# Patient Record
Sex: Female | Born: 1935 | Hispanic: Yes | Marital: Married | Smoking: Never smoker
Health system: Southern US, Community
[De-identification: ages and names within clinical notes are randomized; demographics above are authoritative.]

## PROBLEM LIST (undated history)

## (undated) DIAGNOSIS — E1169 Type 2 diabetes mellitus with other specified complication: Secondary | ICD-10-CM

## (undated) DIAGNOSIS — I10 Essential (primary) hypertension: Secondary | ICD-10-CM

## (undated) DIAGNOSIS — E669 Obesity, unspecified: Secondary | ICD-10-CM

---

## 2015-08-14 ENCOUNTER — Other Ambulatory Visit: Payer: Self-pay

## 2015-08-14 ENCOUNTER — Emergency Department: Payer: Medicaid Other

## 2015-08-14 ENCOUNTER — Inpatient Hospital Stay
Admission: EM | Admit: 2015-08-14 | Discharge: 2015-08-16 | DRG: 195 | Disposition: A | Discharge status: Home / Self Care | Payer: Medicaid Other | Attending: Internal Medicine | Admitting: Internal Medicine

## 2015-08-14 DIAGNOSIS — E1140 Type 2 diabetes mellitus with diabetic neuropathy, unspecified: Secondary | ICD-10-CM | POA: Diagnosis present

## 2015-08-14 DIAGNOSIS — Z79899 Other long term (current) drug therapy: Secondary | ICD-10-CM | POA: Diagnosis not present

## 2015-08-14 DIAGNOSIS — Z7409 Other reduced mobility: Secondary | ICD-10-CM

## 2015-08-14 DIAGNOSIS — Z7982 Long term (current) use of aspirin: Secondary | ICD-10-CM

## 2015-08-14 DIAGNOSIS — Z6827 Body mass index (BMI) 27.0-27.9, adult: Secondary | ICD-10-CM

## 2015-08-14 DIAGNOSIS — E1165 Type 2 diabetes mellitus with hyperglycemia: Secondary | ICD-10-CM | POA: Diagnosis present

## 2015-08-14 DIAGNOSIS — E669 Obesity, unspecified: Secondary | ICD-10-CM | POA: Diagnosis present

## 2015-08-14 DIAGNOSIS — E785 Hyperlipidemia, unspecified: Secondary | ICD-10-CM | POA: Diagnosis present

## 2015-08-14 DIAGNOSIS — I10 Essential (primary) hypertension: Secondary | ICD-10-CM | POA: Diagnosis present

## 2015-08-14 DIAGNOSIS — J189 Pneumonia, unspecified organism: Secondary | ICD-10-CM | POA: Diagnosis present

## 2015-08-14 DIAGNOSIS — R110 Nausea: Secondary | ICD-10-CM

## 2015-08-14 HISTORY — DX: Type 2 diabetes mellitus with other specified complication: E66.9

## 2015-08-14 HISTORY — DX: Essential (primary) hypertension: I10

## 2015-08-14 HISTORY — DX: Type 2 diabetes mellitus with other specified complication: E11.69

## 2015-08-14 LAB — URINALYSIS COMPLETE WITH MICROSCOPIC (ARMC ONLY)
Bilirubin Urine: NEGATIVE
GLUCOSE, UA: 50 mg/dL — AB
Ketones, ur: NEGATIVE mg/dL
Leukocytes, UA: NEGATIVE
Nitrite: NEGATIVE
PROTEIN: 100 mg/dL — AB
SPECIFIC GRAVITY, URINE: 1.016 (ref 1.005–1.030)
pH: 5 (ref 5.0–8.0)

## 2015-08-14 LAB — BLOOD GAS, VENOUS
ACID-BASE EXCESS: 3 mmol/L (ref 0.0–3.0)
BICARBONATE: 28.5 meq/L — AB (ref 21.0–28.0)
PCO2 VEN: 46 mmHg (ref 44.0–60.0)
PH VEN: 7.4 (ref 7.320–7.430)
Patient temperature: 37

## 2015-08-14 LAB — COMPREHENSIVE METABOLIC PANEL
ALBUMIN: 3.7 g/dL (ref 3.5–5.0)
ALK PHOS: 96 U/L (ref 38–126)
ALT: 20 U/L (ref 14–54)
AST: 23 U/L (ref 15–41)
Anion gap: 8 (ref 5–15)
BILIRUBIN TOTAL: 0.8 mg/dL (ref 0.3–1.2)
BUN: 22 mg/dL — AB (ref 6–20)
CALCIUM: 8.5 mg/dL — AB (ref 8.9–10.3)
CO2: 25 mmol/L (ref 22–32)
CREATININE: 0.92 mg/dL (ref 0.44–1.00)
Chloride: 102 mmol/L (ref 101–111)
GFR calc Af Amer: >60 mL/min (ref >60)
GFR calc non Af Amer: 58 mL/min — ABNORMAL LOW (ref >60)
GLUCOSE: 178 mg/dL — AB (ref 65–99)
Potassium: 4 mmol/L (ref 3.5–5.1)
SODIUM: 135 mmol/L (ref 135–145)
TOTAL PROTEIN: 7.8 g/dL (ref 6.5–8.1)

## 2015-08-14 LAB — CBC
HEMATOCRIT: 37.8 % (ref 35.0–47.0)
HEMOGLOBIN: 12.8 g/dL (ref 12.0–16.0)
MCH: 30.9 pg (ref 26.0–34.0)
MCHC: 33.9 g/dL (ref 32.0–36.0)
MCV: 91.2 fL (ref 80.0–100.0)
Platelets: 204 10*3/uL (ref 150–440)
RBC: 4.15 MIL/uL (ref 3.80–5.20)
RDW: 12.8 % (ref 11.5–14.5)
WBC: 14.1 10*3/uL — ABNORMAL HIGH (ref 3.6–11.0)

## 2015-08-14 MED ORDER — LEVOFLOXACIN IN D5W 750 MG/150ML IV SOLN
750.0000 mg | Freq: Once | INTRAVENOUS | Status: AC
  Administered 2015-08-14: 750 mg via INTRAVENOUS
  Filled 2015-08-14: qty 150

## 2015-08-14 MED ORDER — SODIUM CHLORIDE 0.9 % IV BOLUS (SEPSIS)
500.0000 mL | Freq: Once | INTRAVENOUS | Status: AC
  Administered 2015-08-14: 500 mL via INTRAVENOUS

## 2015-08-14 MED ORDER — ACETAMINOPHEN 500 MG PO TABS
1000.0000 mg | ORAL_TABLET | Freq: Once | ORAL | Status: AC
Start: 2015-08-14 — End: 2015-08-14
  Administered 2015-08-14: 1000 mg via ORAL
  Filled 2015-08-14: qty 2

## 2015-08-14 NOTE — ED Notes (Signed)
MD at bedside for reeval

## 2015-08-14 NOTE — ED Notes (Signed)
Pt ambulatory to and from restroom with assistance without incident. 

## 2015-08-14 NOTE — ED Notes (Signed)
Interpreter requested; assisted pt out of car to wheelchair; son reports pt started feeling bad this am with fever, cough and body aches; pt awake and alert at present

## 2015-08-14 NOTE — ED Provider Notes (Signed)
[facility] Emergency Department Provider Note  ____________________________________________  Time seen: Approximately 9:30 PM  I have reviewed the triage vital signs and the nursing notes.   HISTORY  Chief Complaint Generalized Body Aches; Cough; and Fever    HPI Carly Jones is a 81 y.o. female , with a history of DM 2, presenting for fever, decreased mobility, cough, nausea without vomiting.  Per family and patient, her symptoms started earlier today. No vomiting, diarrhea, abdominal pain, known sick contacts or recent travel outside the [place]. Patient was hyperglycemic at home to the 300s today.   No past medical history on file. DM2  There are no active problems to display for this patient.   No past surgical history on file.  No current outpatient prescriptions on file.  Allergies Review of patient's allergies indicates no known allergies.  No family history on file.  Social History Social History  Substance Use Topics  . Smoking status: Not on file  . Smokeless tobacco: Not on file  . Alcohol Use: Not on file    Review of Systems  Constitutional: Positive for fever. No syncope. Eyes: No visual changes. ENT: No sore throat. Cardiovascular: Denies chest pain, palpitations. Respiratory: Denies shortness of breath.  Nonproductive cough. Gastrointestinal: No abdominal pain.  Nausea without vomiting.  No diarrhea.  No constipation. Genitourinary: Negative for dysuria. Musculoskeletal: Negative for back pain. Skin: Negative for rash. Neurological: Negative for headaches, focal weakness or numbness.  10-point ROS otherwise negative.  ____________________________________________   PHYSICAL EXAM:  VITAL SIGNS: ED Triage Vitals  Enc Vitals Group     BP 08/14/15 2123 135/60 mmHg     Pulse Rate 08/14/15 2123 84     Resp --      Temp 08/14/15 2123 102.2 F (39 C)     Temp Source 08/14/15 2123 Oral     SpO2  08/14/15 2123 95 %     Weight 08/14/15 2123 161 lb (73.029 kg)     Height 08/14/15 2123  (1.575 m)     Head Cir --      Peak Flow --      Pain Score 08/14/15 2128 6     Pain Loc --      Pain Edu? --      Excl. in GC? --     Constitutional: Elderly-appearing female who appears chronically ill. She is alert and oriented and able to answer questions appropriately.  Eyes: Conjunctivae are normal.  EOMI. Head: Atraumatic. Nose: No congestion/rhinnorhea. Mouth/Throat: Mucous membranes are mildly dry.  Neck: No stridor.  Supple. Full range of motion without pain  Cardiovascular: Normal rate, regular rhythm. No murmurs, rubs or gallops.  Respiratory: Normal respiratory effort.  No retractions. Lungs CTAB.  No wheezes, rales or ronchi. Gastrointestinal: Obese. Soft and nontender. No distention. No peritoneal signs. Musculoskeletal: Mild nonpitting pedal edema. Neurologic:  Normal speech and language. No gross focal neurologic deficits are appreciated.  Skin:  Skin is warm, dry and intact. No rash noted. Psychiatric: Mood and affect are normal. Speech and behavior are normal.  Normal judgement.  ____________________________________________   LABS (all labs ordered are listed, but only abnormal results are displayed)  Labs Reviewed  URINE CULTURE  CULTURE, BLOOD (ROUTINE X 2)  CULTURE, BLOOD (ROUTINE X 2)  CBC  COMPREHENSIVE METABOLIC PANEL  URINALYSIS COMPLETEWITH MICROSCOPIC ([facility] ONLY)  BLOOD GAS, VENOUS   ____________________________________________  EKG  ED ECG REPORT I, Rockne Menghini, the attending physician, personally viewed and  interpreted this ECG.   Date: 08/14/2015  EKG Time: 2137  Rate: 82  Rhythm: normal sinus rhythm  Axis: Leftward  Intervals:none  ST&T Change: No ST elevation, no ischemic changes.  ____________________________________________  RADIOLOGY  No results  found.  ____________________________________________   PROCEDURES  Procedure(s) performed: None  Critical Care performed: No ____________________________________________   INITIAL IMPRESSION / ASSESSMENT AND PLAN / ED COURSE  Pertinent labs & imaging results that were available during my care of the patient were reviewed by me and considered in my medical decision making (see chart for details).  81 y.o. female with a history of diabetes presenting with fever, cough, and decreased mobility.  Plan to evaluate for possible pneumonia, rule out UTI, consider bacteremia. There is no sign of meningismus at this time.  ____________________________________________  FINAL CLINICAL IMPRESSION(S) / ED DIAGNOSES  Final diagnoses:  None      NEW MEDICATIONS STARTED DURING THIS VISIT:  New Prescriptions   No medications on file     Rockne Menghini, MD 08/14/15 2139

## 2015-08-15 ENCOUNTER — Encounter: Payer: Self-pay | Admitting: Internal Medicine

## 2015-08-15 LAB — GLUCOSE, CAPILLARY
Glucose-Capillary: 151 mg/dL — ABNORMAL HIGH (ref 65–99)
Glucose-Capillary: 149 mg/dL — ABNORMAL HIGH (ref 65–99)
Glucose-Capillary: 181 mg/dL — ABNORMAL HIGH (ref 65–99)
Glucose-Capillary: 202 mg/dL — ABNORMAL HIGH (ref 65–99)
Glucose-Capillary: 158 mg/dL — ABNORMAL HIGH (ref 65–99)

## 2015-08-15 LAB — HEMOGLOBIN A1C: Hgb A1c MFr Bld: 10.2 % — ABNORMAL HIGH (ref 4.0–6.0)

## 2015-08-15 MED ORDER — ATORVASTATIN CALCIUM 20 MG PO TABS
10.0000 mg | ORAL_TABLET | Freq: Every day | ORAL | Status: DC
  Administered 2015-08-15 – 2015-08-16 (×2): 10 mg via ORAL
  Filled 2015-08-15 (×2): qty 1

## 2015-08-15 MED ORDER — SODIUM CHLORIDE 0.9 % IJ SOLN
3.0000 mL | Freq: Two times a day (BID) | INTRAMUSCULAR | Status: DC
  Administered 2015-08-15: 3 mL via INTRAVENOUS

## 2015-08-15 MED ORDER — ONDANSETRON HCL 4 MG PO TABS: 4.0000 mg | ORAL_TABLET | Freq: Four times a day (QID) | ORAL | Status: DC | PRN

## 2015-08-15 MED ORDER — LISINOPRIL 10 MG PO TABS
10.0000 mg | ORAL_TABLET | Freq: Every day | ORAL | Status: DC
  Administered 2015-08-15 – 2015-08-16 (×2): 10 mg via ORAL
  Filled 2015-08-15 (×2): qty 1

## 2015-08-15 MED ORDER — ASPIRIN 81 MG PO CHEW
81.0000 mg | CHEWABLE_TABLET | Freq: Every day | ORAL | Status: DC
  Administered 2015-08-15 – 2015-08-16 (×2): 81 mg via ORAL
  Filled 2015-08-15 (×2): qty 1

## 2015-08-15 MED ORDER — GUAIFENESIN ER 600 MG PO TB12
600.0000 mg | ORAL_TABLET | Freq: Two times a day (BID) | ORAL | Status: DC
  Administered 2015-08-15 – 2015-08-16 (×2): 600 mg via ORAL
  Filled 2015-08-15 (×2): qty 1

## 2015-08-15 MED ORDER — ACETAMINOPHEN 325 MG PO TABS
650.0000 mg | ORAL_TABLET | Freq: Four times a day (QID) | ORAL | Status: DC | PRN
  Administered 2015-08-16: 650 mg via ORAL
  Filled 2015-08-15: qty 2

## 2015-08-15 MED ORDER — GABAPENTIN 300 MG PO CAPS
300.0000 mg | ORAL_CAPSULE | Freq: Three times a day (TID) | ORAL | Status: DC
  Administered 2015-08-15 – 2015-08-16 (×4): 300 mg via ORAL
  Filled 2015-08-15 (×4): qty 1

## 2015-08-15 MED ORDER — ONDANSETRON HCL 4 MG/2ML IJ SOLN
4.0000 mg | Freq: Four times a day (QID) | INTRAMUSCULAR | Status: DC | PRN
  Filled 2015-08-15: qty 2

## 2015-08-15 MED ORDER — MORPHINE SULFATE (PF) 2 MG/ML IV SOLN
1.0000 mg | INTRAVENOUS | Status: DC | PRN
  Administered 2015-08-15 – 2015-08-16 (×2): 1 mg via INTRAVENOUS
  Filled 2015-08-15 (×2): qty 1

## 2015-08-15 MED ORDER — SODIUM CHLORIDE 0.9 % IV SOLN
INTRAVENOUS | Status: DC
  Administered 2015-08-15 – 2015-08-16 (×3): via INTRAVENOUS

## 2015-08-15 MED ORDER — INSULIN ASPART 100 UNIT/ML ~~LOC~~ SOLN: 0.0000 [IU] | Freq: Every day | SUBCUTANEOUS | Status: DC

## 2015-08-15 MED ORDER — ACETAMINOPHEN 650 MG RE SUPP: 650.0000 mg | Freq: Four times a day (QID) | RECTAL | Status: DC | PRN

## 2015-08-15 MED ORDER — DOCUSATE SODIUM 100 MG PO CAPS
100.0000 mg | ORAL_CAPSULE | Freq: Two times a day (BID) | ORAL | Status: DC
  Administered 2015-08-15 – 2015-08-16 (×4): 100 mg via ORAL
  Filled 2015-08-15 (×4): qty 1

## 2015-08-15 MED ORDER — INSULIN ASPART 100 UNIT/ML ~~LOC~~ SOLN
0.0000 [IU] | Freq: Three times a day (TID) | SUBCUTANEOUS | Status: DC
  Administered 2015-08-15: 1 [IU] via SUBCUTANEOUS
  Administered 2015-08-15: 3 [IU] via SUBCUTANEOUS
  Administered 2015-08-15: 2 [IU] via SUBCUTANEOUS
  Administered 2015-08-16: 3 [IU] via SUBCUTANEOUS
  Filled 2015-08-15: qty 1
  Filled 2015-08-15 (×2): qty 3
  Filled 2015-08-15: qty 2

## 2015-08-15 MED ORDER — PNEUMOCOCCAL VAC POLYVALENT 25 MCG/0.5ML IJ INJ: 0.5000 mL | INJECTION | INTRAMUSCULAR | Status: DC

## 2015-08-15 MED ORDER — HEPARIN SODIUM (PORCINE) 5000 UNIT/ML IJ SOLN
5000.0000 [IU] | Freq: Three times a day (TID) | INTRAMUSCULAR | Status: DC
  Administered 2015-08-15 – 2015-08-16 (×6): 5000 [IU] via SUBCUTANEOUS
  Filled 2015-08-15 (×6): qty 1

## 2015-08-15 MED ORDER — IPRATROPIUM-ALBUTEROL 0.5-2.5 (3) MG/3ML IN SOLN
3.0000 mL | Freq: Four times a day (QID) | RESPIRATORY_TRACT | Status: DC | PRN
  Administered 2015-08-15: 3 mL via RESPIRATORY_TRACT

## 2015-08-15 MED ORDER — LEVOFLOXACIN IN D5W 750 MG/150ML IV SOLN
750.0000 mg | INTRAVENOUS | Status: DC
  Filled 2015-08-15: qty 150

## 2015-08-15 MED ORDER — IPRATROPIUM-ALBUTEROL 0.5-2.5 (3) MG/3ML IN SOLN
RESPIRATORY_TRACT | Status: AC
  Administered 2015-08-15: 3 mL via RESPIRATORY_TRACT
  Filled 2015-08-15: qty 3

## 2015-08-15 NOTE — Progress Notes (Signed)
[facility] - [place] at [facility]   PATIENT NAME: Carly Jones    MR#:  [id number]  DATE OF BIRTH:  05-14-1934  SUBJECTIVE:  CHIEF COMPLAINT:   Chief Complaint  Patient presents with  . Generalized Body Aches  . Cough  . Fever   Patient still has a cough but afebrile now. Family at bedside. Patient seen with the help of the interpreter. Patient complaining of persistent nausea after eating for months.  REVIEW OF SYSTEMS:    Review of Systems  Constitutional: Negative for fever and chills.  HENT: Negative for congestion and tinnitus.   Eyes: Negative for blurred vision and double vision.  Respiratory: Positive for cough and sputum production. Negative for shortness of breath and wheezing.   Cardiovascular: Negative for chest pain, orthopnea and PND.  Gastrointestinal: Positive for nausea. Negative for vomiting, abdominal pain and diarrhea.  Genitourinary: Negative for dysuria and hematuria.  Neurological: Negative for dizziness, sensory change and focal weakness.  All other systems reviewed and are negative.   Nutrition: Carb control Tolerating Diet: Yes Tolerating PT: Ambulatory      DRUG ALLERGIES:  No Known Allergies  VITALS:  Blood pressure 124/51, pulse 67, temperature 98.7 F (37.1 C), temperature source Oral, resp. rate 16, height  (1.575 m), weight 68.629 kg (151 lb 4.8 oz), SpO2 99 %.  PHYSICAL EXAMINATION:   Physical Exam  GENERAL:  81 y.o.-year-old patient lying in the bed with no acute distress.  EYES: Pupils equal, round, reactive to light and accommodation. No scleral icterus. Extraocular muscles intact.  HEENT: Head atraumatic, normocephalic. Oropharynx and nasopharynx clear.  NECK:  Supple, no jugular venous distention. No thyroid enlargement, no tenderness.  LUNGS: Normal breath sounds bilaterally, no wheezing, rales, rhonchi. No use of accessory muscles of respiration.  CARDIOVASCULAR: S1, S2 normal. No  murmurs, rubs, or gallops.  ABDOMEN: Soft, nontender, nondistended. Bowel sounds present. No organomegaly or mass.  EXTREMITIES: No cyanosis, clubbing or edema b/l.    NEUROLOGIC: Cranial nerves II through XII are intact. No focal Motor or sensory deficits b/l. Globally weak   PSYCHIATRIC: The patient is alert and oriented x 3. Good affect SKIN: No obvious rash, lesion, or ulcer.    LABORATORY PANEL:   CBC  Recent Labs Lab 08/14/15 2144  WBC 14.1*  HGB 12.8  HCT 37.8  PLT 204   ------------------------------------------------------------------------------------------------------------------  Chemistries   Recent Labs Lab 08/14/15 2144  NA 135  K 4.0  CL 102  CO2 25  GLUCOSE 178*  BUN 22*  CREATININE 0.92  CALCIUM 8.5*  AST 23  ALT 20  ALKPHOS 96  BILITOT 0.8   ------------------------------------------------------------------------------------------------------------------  Cardiac Enzymes No results for input(s): TROPONINI in the last 168 hours. ------------------------------------------------------------------------------------------------------------------  RADIOLOGY:  Dg Chest 2 View  08/14/2015   CLINICAL DATA:  Acute onset of fever, cough and body aches. Initial encounter.  EXAM: CHEST  2 VIEW  COMPARISON:  None.  FINDINGS: The lungs are hypoexpanded. Mild bibasilar opacities may reflect atelectasis or possibly mild pneumonia, given the patient's symptoms. A small left pleural effusion is seen. There is no evidence of pneumothorax.  The heart is enlarged.  No acute osseous abnormalities are seen.  IMPRESSION: Lungs hypoexpanded. Mild bibasilar opacities may reflect atelectasis or possibly mild pneumonia, given the patient's symptoms. Small left pleural effusion seen. Cardiomegaly.   Electronically Signed   By: Roanna Raider M.D.   On: 08/14/2015 22:23     ASSESSMENT AND PLAN:  81 year old Hispanic female with past medical history of diabetes,  hypertension, diabetic neuropathy, hyperlipidemia who presented to the hospital due to cough and congestion and wheezing and noted to have a pneumonia.  #1 pneumonia-likely cause of patient's shortness of breath, congestion, wheezing. -Continue Levaquin. Follow sputum and blood cultures. -Afebrile and hemodynamically stable presently.  #2 persistent nausea-patient has had ongoing nausea for now I'll months.  -Etiology unclear and could be suspected gastroparesis, delayed gastric emptying. -We'll get an upper GI series tomorrow. Patient likely would benefit from a gastric emptying study to be done as an outpatient. She may also benefit from a gastroenterology consult as outpatient.  #3 diabetes type 2 with neuropathy-continue sliding scale insulin. Hold metformin.  #4 hypertension-continue lisinopril.  #5 diabetic neuropathy-continue Neurontin.  #6 hyperlipidemia-continue atorvastatin.    All the records are reviewed and case discussed with Care Management/Social Workerr. Management plans discussed with the patient, family and they are in agreement.  CODE STATUS: Full  DVT Prophylaxis: Heparin subcutaneous  TOTAL TIME TAKING CARE OF THIS PATIENT: 30 minutes.   POSSIBLE D/C IN 1-2 DAYS, DEPENDING ON CLINICAL CONDITION.   Houston Siren M.D on 08/15/2015 at 12:15 PM  Between 7am to 6pm - Pager - [phone]  After 6pm go to [email or web address] - password [email or web address] [facility]  [facility]  Office  [phone]  CC: Primary care physician; [facility]

## 2015-08-15 NOTE — Progress Notes (Signed)
Notified Dr Cherlynn Kaiser of pt c/o mucous in throat and feeling that she is having trouble breathing because of that; Dr acknowledged, ordered mucinex 600 mg BID and duonebs q6 hrs PRN

## 2015-08-15 NOTE — ED Notes (Signed)
Admitting MD at bedside.

## 2015-08-15 NOTE — H&P (Signed)
Carly Jones is an 81 y.o. female.    Chief Complaint: Cough HPI: The patient presents to emergency department complaining of nonproductive cough, fever and nausea 1 day. She denies shortness of breath. She has had no chest pain, vomiting or diarrhea. She admits to decreased appetite and generalized weakness. In the emergency department the patient was found to be febrile with leukocytosis and bibasilar opacities on chest x-ray which brought to the emergency department staff to call for admission.  Past Medical History  Diagnosis Date  . Diabetes mellitus type 2 in obese ([place])   . Essential hypertension     No past surgical history on file. "Many in the past" per daughter, but she cannot remember sepcifics  Family History  Problem Relation Age of Onset  .     None that she knows   Social History:  has no tobacco, alcohol, and drug history on file.  Allergies: No Known Allergies  Prior to Admission medications   Medication Sig Start Date End Date Taking? Authorizing Provider  aspirin 81 MG tablet Take 81 mg by mouth daily.   Yes Historical Provider, MD  atorvastatin (LIPITOR) 10 MG tablet Take 10 mg by mouth daily.   Yes Historical Provider, MD  gabapentin (NEURONTIN) 300 MG capsule Take 300 mg by mouth 3 (three) times daily.   Yes Historical Provider, MD  lisinopril (PRINIVIL,ZESTRIL) 10 MG tablet Take 10 mg by mouth daily.   Yes Historical Provider, MD  metFORMIN (GLUCOPHAGE) 1000 MG tablet Take 1,000 mg by mouth 2 (two) times daily with a meal.   Yes Historical Provider, MD     Results for orders placed or performed during the hospital encounter of 08/14/15 (from the past 48 hour(s))  Blood gas, venous     Status: Abnormal   Collection Time: 08/14/15  9:30 PM  Result Value Ref Range   pH, Ven 7.40 7.320 - 7.430   pCO2, Ven 46 44.0 - 60.0 mmHg   Bicarbonate 28.5 (H) 21.0 - 28.0 mEq/L   Acid-Base Excess 3.0 0.0 - 3.0 mmol/L   Patient temperature 37.0    Collection  site LEFT ANTECUBITAL    Sample type VENOUS   CBC     Status: Abnormal   Collection Time: 08/14/15  9:44 PM  Result Value Ref Range   WBC 14.1 (H) 3.6 - 11.0 K/uL   RBC 4.15 3.80 - 5.20 MIL/uL   Hemoglobin 12.8 12.0 - 16.0 g/dL   HCT 37.8 35.0 - 47.0 %   MCV 91.2 80.0 - 100.0 fL   MCH 30.9 26.0 - 34.0 pg   MCHC 33.9 32.0 - 36.0 g/dL   RDW 12.8 11.5 - 14.5 %   Platelets 204 150 - 440 K/uL  Comprehensive metabolic panel     Status: Abnormal   Collection Time: 08/14/15  9:44 PM  Result Value Ref Range   Sodium 135 135 - 145 mmol/L   Potassium 4.0 3.5 - 5.1 mmol/L   Chloride 102 101 - 111 mmol/L   CO2 25 22 - 32 mmol/L   Glucose, Bld 178 (H) 65 - 99 mg/dL   BUN 22 (H) 6 - 20 mg/dL   Creatinine, Ser 0.92 0.44 - 1.00 mg/dL   Calcium 8.5 (L) 8.9 - 10.3 mg/dL   Total Protein 7.8 6.5 - 8.1 g/dL   Albumin 3.7 3.5 - 5.0 g/dL   AST 23 15 - 41 U/L   ALT 20 14 - 54 U/L   Alkaline Phosphatase 96 38 -  126 U/L   Total Bilirubin 0.8 0.3 - 1.2 mg/dL   GFR calc non Af Amer 58 (L) >60 mL/min   GFR calc Af Amer >60 >60 mL/min    Comment: (NOTE) The eGFR has been calculated using the CKD EPI equation. This calculation has not been validated in all clinical situations. eGFR's persistently <60 mL/min signify possible Chronic Kidney Disease.    Anion gap 8 5 - 15  Urinalysis complete, with microscopic ([facility] only)     Status: Abnormal   Collection Time: 08/14/15  9:44 PM  Result Value Ref Range   Color, Urine YELLOW (A) YELLOW   APPearance CLEAR (A) CLEAR   Glucose, UA 50 (A) NEGATIVE mg/dL   Bilirubin Urine NEGATIVE NEGATIVE   Ketones, ur NEGATIVE NEGATIVE mg/dL   Specific Gravity, Urine 1.016 1.005 - 1.030   Hgb urine dipstick 2+ (A) NEGATIVE   pH 5.0 5.0 - 8.0   Protein, ur 100 (A) NEGATIVE mg/dL   Nitrite NEGATIVE NEGATIVE   Leukocytes, UA NEGATIVE NEGATIVE   RBC / HPF 0-5 0 - 5 RBC/hpf   WBC, UA 0-5 0 - 5 WBC/hpf   Bacteria, UA FEW (A) NONE SEEN   Squamous Epithelial / LPF 0-5 (A)  NONE SEEN   Mucous PRESENT    Hyaline Casts, UA PRESENT    Dg Chest 2 View  08/14/2015   CLINICAL DATA:  Acute onset of fever, cough and body aches. Initial encounter.  EXAM: CHEST  2 VIEW  COMPARISON:  None.  FINDINGS: The lungs are hypoexpanded. Mild bibasilar opacities may reflect atelectasis or possibly mild pneumonia, given the patient's symptoms. A small left pleural effusion is seen. There is no evidence of pneumothorax.  The heart is enlarged.  No acute osseous abnormalities are seen.  IMPRESSION: Lungs hypoexpanded. Mild bibasilar opacities may reflect atelectasis or possibly mild pneumonia, given the patient's symptoms. Small left pleural effusion seen. Cardiomegaly.   Electronically Signed   By: Garald Balding M.D.   On: 08/14/2015 22:23    Review of Systems  Constitutional: Positive for fever. Negative for chills.  HENT: Negative for sore throat and tinnitus.   Eyes: Negative for blurred vision and redness.  Respiratory: Positive for cough. Negative for shortness of breath.   Cardiovascular: Negative for chest pain, palpitations, orthopnea and PND.  Gastrointestinal: Positive for nausea. Negative for vomiting, abdominal pain and diarrhea.  Genitourinary: Negative for dysuria, urgency and frequency.  Musculoskeletal: Negative for myalgias and joint pain.  Skin: Negative for rash.       No lesions  Neurological: Negative for speech change, focal weakness and weakness.  Endo/Heme/Allergies: Does not bruise/bleed easily.       No temperature intolerance  Psychiatric/Behavioral: Negative for depression and suicidal ideas.    Blood pressure 134/67, pulse 68, temperature 100.3 F (37.9 C), temperature source Oral, resp. rate 18, height 5' 2"  (1.575 m), weight 73.029 kg (161 lb), SpO2 95 %. Physical Exam  Nursing note and vitals reviewed. Constitutional: She is oriented to person, place, and time. She appears well-developed and well-nourished. No distress.  HENT:  Head:  Normocephalic and atraumatic.  Mouth/Throat: Oropharynx is clear and moist.  Eyes: Conjunctivae and EOM are normal. Pupils are equal, round, and reactive to light. No scleral icterus.  Neck: Normal range of motion. Neck supple. No JVD present. No tracheal deviation present. No thyromegaly present.  Cardiovascular: Normal rate, regular rhythm and normal heart sounds.  Exam reveals no gallop and no friction rub.   No  murmur heard. Respiratory: Effort normal and breath sounds normal.  GI: Soft. Bowel sounds are normal. She exhibits no distension. There is no tenderness.  Genitourinary:  deferred  Musculoskeletal: Normal range of motion. She exhibits no edema.  Lymphadenopathy:    She has no cervical adenopathy.  Neurological: She is alert and oriented to person, place, and time. No cranial nerve deficit. She exhibits normal muscle tone.  Skin: Skin is warm and dry.  Psychiatric: She has a normal mood and affect. Her behavior is normal. Judgment and thought content normal.     Assessment/Plan This is a 81 year old Hispanic female admitted for pneumonia. 1. Pneumonia: Community-acquired. The patient has received a half liter bolus of normal saline in the emergency department as well as Levaquin. She has no oxygen requirement and is not tachypneic. Pneumonia severity index 89. 2. Sepsis: Patient meets criteria via temperature and leukocytosis. Blood cultures obtained in the emergency department and we will follow for gross and sensitivities. She is hemodynamically stable. 3. Diabetes mellitus type 2: Sliding scale insulin while the patient is hospitalized.  4. Essential hypertension: Continue lisinopril 5. Hyperlipidemia: Continue statin therapy 6. DVT prolactins: Heparin 7. GI prophylaxis: None The patient is a full code. Time spent on admission orders and patient care approximately 35 minutes  Harrie Foreman 08/15/2015, 12:24 AM

## 2015-08-15 NOTE — Progress Notes (Signed)
ANTIBIOTIC CONSULT NOTE - INITIAL  Pharmacy Consult for Levaquin dosing Indication: pneumonia  No Known Allergies  Patient Measurements: Height:  (157.5 cm) Weight: 161 lb (73.029 kg) IBW/kg (Calculated) : 50.1 Adjusted Body Weight: n/a  Vital Signs: Temp: 97.8 F (36.6 C) (10/09 0124) Temp Source: Oral (10/09 0124) BP: 116/48 mmHg (10/09 0124) Pulse Rate: 63 (10/09 0124) Intake/Output from previous day:   Intake/Output from this shift:    Labs:  Recent Labs  08/14/15 2144  WBC 14.1*  HGB 12.8  PLT 204  CREATININE 0.92   Estimated Creatinine Clearance: 46.4 mL/min (by C-G formula based on Cr of 0.92). No results for input(s): VANCOTROUGH, VANCOPEAK, VANCORANDOM, GENTTROUGH, GENTPEAK, GENTRANDOM, TOBRATROUGH, TOBRAPEAK, TOBRARND, AMIKACINPEAK, AMIKACINTROU, AMIKACIN in the last 72 hours.   Microbiology: No results found for this or any previous visit (from the past 720 hour(s)).  Medical History: Past Medical History  Diagnosis Date  . Diabetes mellitus type 2 in obese ([facility])   . Essential hypertension     Medications:   Assessment: Blood and urine cx pending UA: (-) CXR: bibasilar opacities  Goal of Therapy:  Resolve infection  Plan:  Levaquin 750 mg IV q 48 hours ordered.  Gianina Olinde S 08/15/2015,2:49 AM

## 2015-08-16 ENCOUNTER — Other Ambulatory Visit: Payer: Self-pay

## 2015-08-16 LAB — CBC
HEMATOCRIT: 32.8 % — AB (ref 35.0–47.0)
HEMOGLOBIN: 11.4 g/dL — AB (ref 12.0–16.0)
MCH: 31.8 pg (ref 26.0–34.0)
MCHC: 34.9 g/dL (ref 32.0–36.0)
MCV: 91.3 fL (ref 80.0–100.0)
Platelets: 184 10*3/uL (ref 150–440)
RBC: 3.59 MIL/uL — AB (ref 3.80–5.20)
RDW: 13 % (ref 11.5–14.5)
WBC: 8.4 10*3/uL (ref 3.6–11.0)

## 2015-08-16 LAB — GLUCOSE, CAPILLARY
Glucose-Capillary: 160 mg/dL — ABNORMAL HIGH (ref 65–99)
Glucose-Capillary: 228 mg/dL — ABNORMAL HIGH (ref 65–99)
Glucose-Capillary: 234 mg/dL — ABNORMAL HIGH (ref 65–99)

## 2015-08-16 MED ORDER — GUAIFENESIN ER 600 MG PO TB12: 600.0000 mg | ORAL_TABLET | Freq: Two times a day (BID) | ORAL | Status: AC

## 2015-08-16 MED ORDER — SODIUM CHLORIDE 0.9 % IJ SOLN
INTRAMUSCULAR | Status: AC
  Filled 2015-08-16: qty 10

## 2015-08-16 MED ORDER — AMOXICILLIN-POT CLAVULANATE 875-125 MG PO TABS: 1.0000 | ORAL_TABLET | Freq: Two times a day (BID) | ORAL | Status: DC

## 2015-08-16 MED ORDER — AMOXICILLIN-POT CLAVULANATE 875-125 MG PO TABS: 1.0000 | ORAL_TABLET | Freq: Two times a day (BID) | ORAL | Status: AC

## 2015-08-16 MED ORDER — LIVING WELL WITH DIABETES BOOK - IN SPANISH
Freq: Once | Status: DC
  Filled 2015-08-16: qty 1

## 2015-08-16 MED ORDER — METFORMIN HCL 500 MG PO TABS: 1000.0000 mg | ORAL_TABLET | Freq: Two times a day (BID) | ORAL | Status: DC

## 2015-08-16 NOTE — Progress Notes (Signed)
Initial Nutrition Assessment  DOCUMENTATION CODES:      INTERVENTION:  Meals and snacks: Cater to pt preferences  Nutrition diet education: Discussed DM diet as family with questions.  Family verbalized understanding and expect good compliance  NUTRITION DIAGNOSIS:    (none at this time) related to   as evidenced by  .    GOAL:   Patient will meet greater than or equal to 90% of their needs    MONITOR:    (Energy intake, Electrolyte and renal profile, Digestive system)  REASON FOR ASSESSMENT:   Malnutrition Screening Tool    ASSESSMENT:      Pt admitted with pneumonia, body aches, fever, nausea, Noted upper GI planned for tomorrow am  Past Medical History  Diagnosis Date  . Diabetes mellitus type 2 in obese ([facility])   . Essential hypertension     Current Nutrition:ate sandwich, soup for lunch today and tolerating well.  Interpreter at bedside  Food/Nutrition-Related History: Pt reports good intake prior to admission, eating 3 meals per day via interpreter   Medications: NS at 148ml/hr, colace, sliding scale insulin  Electrolyte/Renal Profile and Glucose Profile:   Recent Labs Lab 08/14/15 2144  NA 135  K 4.0  CL 102  CO2 25  BUN 22*  CREATININE 0.92  CALCIUM 8.5*  GLUCOSE 178*   Protein Profile:  Recent Labs Lab 08/14/15 2144  ALBUMIN 3.7      Weight Change: stable wt per pt, via interpreter    Diet Order:  Diet heart healthy/carb modified Room service appropriate?: Yes; Fluid consistency:: Thin Diet NPO time specified  Skin:   reviewed   Height:   Ht Readings from Last 1 Encounters:  08/14/15  (1.575 m)    Weight:   Wt Readings from Last 1 Encounters:  08/16/15 152 lb 12.8 oz (69.31 kg)     BMI:  Body mass index is 27.94 kg/(m^2).   EDUCATION NEEDS:   No education needs identified at this time  LOW Care Level  Luz Burcher B. Freida Busman, RD, LDN [phone] (pager)

## 2015-08-16 NOTE — Progress Notes (Signed)
Called in rx for ultram at [id number]

## 2015-08-16 NOTE — Discharge Instructions (Signed)
Check your sugars daily

## 2015-08-16 NOTE — Progress Notes (Signed)
Spoke with patient and family through interpreter Maryjane Hurter) about diabetes and home regimen for diabetes control. Patient reports that she was living in [place] up until about one month ago. Patient's family reports that patient came to [facility] for a visit but family is considering whether she needs to move here. Patient has established care at [facility] and they are assisting her with diabetes management.  Patient reports that she is taking a DM pill twice a day and also taking insulin injection with syringe twice a day. Patient's daughter who helps her with monitoring glucose and insulin is not currently here and patient is unsure what the names of the DM medications are. Patient reports that the insulin is 15 units BID (again, not sure of the name of the insulin). Discussed A1C results (10.2% on 08/14/15) and explained importance of checking CBGs and maintaining good CBG control to prevent long-term and short-term complications. Patient reports that her daughter checks her glucose once a day and she is unsure what her glucose values have been. Discussed target glucose and A1C goals. Encouraged patient and her family to check glucose 4 times per day (before meals and at bedtime) and to keep a log of glucose readings. Instructed patient to take glucose log with her to her next scheduled appointment at [facility].  Informed patient and family that the Living Well With Diabetes booklet they have in the room has a lot of information about diabetes and importance of getting diabetes controled. Encouraged them to read through the booklet and also have patient's daughter who helps her with her diabetes to read the booklet. Encouraged patient and family to check with [facility] and see if they have someone at the clinic who can provide outpatient diabetes education for the patient and family. Patient and family verbalized understanding of information discussed and they state that she have no further questions at this time  related to diabetes. Very appreciative of Maryjane Hurter with interpreter service.  Thanks, Orlando Penner, RN, MSN, CCRN, CDE Diabetes Coordinator Inpatient Diabetes Program [phone] (Team Pager) [phone] ([facility] office) [phone] [facility] office) [phone] [facility] office)

## 2015-08-16 NOTE — Care Management (Signed)
Spoke with patient and family with interpreter present in the room. Patient has family support and attending Dr Allena Katz will be sending patient home on Augmentin 875. Family provided coupon for [facility]. Discounted cost will be $32.90 and family stated that this will be affordable. Family stated that patient is seen at [facility] will need to follow up. Family expressed understanding. Daughter stated that the patient has a meter and strips and that her diabetes medications are provided at [facility] clinic. Family stated that patient ambulates at home hand does not have a history of falls. Uses no DME at home. No other CM needs identified. Attending will discharge today. Coupon and script for ABX placed on chart.

## 2015-08-16 NOTE — Discharge Summary (Signed)
[facility] - [place] at [facility]   PATIENT NAME: Solaris Kram    MR#:  [id number]  DATE OF BIRTH:  [birth year]  DATE OF ADMISSION:  08/14/2015 ADMITTING PHYSICIAN: Arnaldo Natal, MD  DATE OF DISCHARGE: 08/16/15  PRIMARY CARE PHYSICIAN: [facility]    ADMISSION DIAGNOSIS:  Community acquired pneumonia [J18.9] Decreased ambulation status [R68.89]  DISCHARGE DIAGNOSIS:  Pneumonia  SECONDARY DIAGNOSIS:   Past Medical History  Diagnosis Date  . Diabetes mellitus type 2 in obese (HCC)   . Essential hypertension     HOSPITAL COURSE:   81 year old Hispanic female with past medical history of diabetes, hypertension, diabetic neuropathy, hyperlipidemia who presented to the hospital due to cough and congestion and wheezing and noted to have a pneumonia.  #1 pneumonia-likely cause of patient's shortness of breath, congestion, wheezing. -Continue Levaquin--->change to po augmentin (more affordable).  negaitive blood cultures. -Afebrile and hemodynamically stable presently. -UC GNR. augmentin should cove  #2 persistent nausea  Denies any today. Ate lunch and BF per family -Etiology unclear and could be suspected gastroparesis, delayed gastric emptying. - Patient likely would benefit from a gastric emptying study to be done as an outpatient. She may also benefit from a gastroenterology consult as outpatient. This was d/w family to address with PCP at [facility]  #3 diabetes type 2 with neuropathy-continue sliding scale insulin. Resumed metformin.  #4 hypertension-continue lisinopril.  #5 diabetic neuropathy-continue Neurontin.  #6 hyperlipidemia-continue atorvastatin.  Overall improving ok to go home CONSULTS OBTAINED:   none  DRUG ALLERGIES:  No Known Allergies  DISCHARGE MEDICATIONS:   Current Discharge Medication List    START taking these medications   Details  amoxicillin-clavulanate (AUGMENTIN) 875-125  MG tablet Take 1 tablet by mouth every 12 (twelve) hours. Qty: 20 tablet, Refills: 0    guaiFENesin (MUCINEX) 600 MG 12 hr tablet Take 1 tablet (600 mg total) by mouth 2 (two) times daily. Qty: 20 tablet, Refills: 0      CONTINUE these medications which have NOT CHANGED   Details  aspirin 81 MG tablet Take 81 mg by mouth daily.    atorvastatin (LIPITOR) 10 MG tablet Take 10 mg by mouth daily.    gabapentin (NEURONTIN) 300 MG capsule Take 300 mg by mouth 3 (three) times daily.    lisinopril (PRINIVIL,ZESTRIL) 10 MG tablet Take 10 mg by mouth daily.    metFORMIN (GLUCOPHAGE) 1000 MG tablet Take 1,000 mg by mouth 2 (two) times daily with a meal.        If you experience worsening of your admission symptoms, develop shortness of breath, life threatening emergency, suicidal or homicidal thoughts you must seek medical attention immediately by calling 911 or calling your MD immediately  if symptoms less severe.  You Must read complete instructions/literature along with all the possible adverse reactions/side effects for all the Medicines you take and that have been prescribed to you. Take any new Medicines after you have completely understood and accept all the possible adverse reactions/side effects.   Please note  You were cared for by a hospitalist during your hospital stay. If you have any questions about your discharge medications or the care you received while you were in the hospital after you are discharged, you can call the unit and asked to speak with the hospitalist on call if the hospitalist that took care of you is not available. Once you are discharged, your primary care physician will handle any further medical issues. Please  note that NO REFILLS for any discharge medications will be authorized once you are discharged, as it is imperative that you return to your primary care physician (or establish a relationship with a primary care physician if you do not have one) for your  aftercare needs so that they can reassess your need for medications and monitor your lab values. Today   SUBJECTIVE   Via interpreter. Some cough  VITAL SIGNS:  Blood pressure 135/62, pulse 72, temperature 97.3 F (36.3 C), temperature source Oral, resp. rate 18, height 5\' 2"  (1.575 m), weight 69.31 kg (152 lb 12.8 oz), SpO2 96 %.  I/O:   Intake/Output Summary (Last 24 hours) at 08/16/15 1452 Last data filed at 08/16/15 1330  Gross per 24 hour  Intake 2374.99 ml  Output      0 ml  Net 2374.99 ml    PHYSICAL EXAMINATION:  GENERAL:  81 y.o.-year-old patient lying in the bed with no acute distress.  EYES: Pupils equal, round, reactive to light and accommodation. No scleral icterus. Extraocular muscles intact.  HEENT: Head atraumatic, normocephalic. Oropharynx and nasopharynx clear.  NECK:  Supple, no jugular venous distention. No thyroid enlargement, no tenderness.  LUNGS: Normal breath sounds bilaterally, no wheezing, rales,rhonchi or crepitation. No use of accessory muscles of respiration.  CARDIOVASCULAR: S1, S2 normal. No murmurs, rubs, or gallops.  ABDOMEN: Soft, non-tender, non-distended. Bowel sounds present. No organomegaly or mass.  EXTREMITIES: No pedal edema, cyanosis, or clubbing.  NEUROLOGIC: Cranial nerves II through XII are intact. Muscle strength 5/5 in all extremities. Sensation intact. Gait not checked.  PSYCHIATRIC: The patient is alert and oriented x 3.  SKIN: No obvious rash, lesion, or ulcer.   DATA REVIEW:   CBC   Recent Labs Lab 08/16/15 0454  WBC 8.4  HGB 11.4*  HCT 32.8*  PLT 184    Chemistries   Recent Labs Lab 08/14/15 2144  NA 135  K 4.0  CL 102  CO2 25  GLUCOSE 178*  BUN 22*  CREATININE 0.92  CALCIUM 8.5*  AST 23  ALT 20  ALKPHOS 96  BILITOT 0.8    Microbiology Results   Recent Results (from the past 240 hour(s))  Urine culture     Status: None (Preliminary result)   Collection Time: 08/14/15  9:44 PM  Result Value Ref  Range Status   Specimen Description URINE, RANDOM  Final   Special Requests Normal  Final   Culture   Final    >=100,000 COLONIES/mL GRAM NEGATIVE RODS IDENTIFICATION AND SUSCEPTIBILITIES TO FOLLOW    Report Status PENDING  Incomplete  Blood culture (routine x 2)     Status: None (Preliminary result)   Collection Time: 08/14/15  9:45 PM  Result Value Ref Range Status   Specimen Description BLOOD RIGHT ARM  Final   Special Requests BOTTLES DRAWN AEROBIC AND ANAEROBIC 4CC  Final   Culture NO GROWTH 2 DAYS  Final   Report Status PENDING  Incomplete  Blood culture (routine x 2)     Status: None (Preliminary result)   Collection Time: 08/14/15  9:45 PM  Result Value Ref Range Status   Specimen Description BLOOD LEFT ASSIST CONTROL  Final   Special Requests BOTTLES DRAWN AEROBIC AND ANAEROBIC 6CC  Final   Culture NO GROWTH 2 DAYS  Final   Report Status PENDING  Incomplete    RADIOLOGY:  Dg Chest 2 View  08/14/2015   CLINICAL DATA:  Acute onset of fever, cough and body aches. Initial  encounter.  EXAM: CHEST  2 VIEW  COMPARISON:  None.  FINDINGS: The lungs are hypoexpanded. Mild bibasilar opacities may reflect atelectasis or possibly mild pneumonia, given the patient's symptoms. A small left pleural effusion is seen. There is no evidence of pneumothorax.  The heart is enlarged.  No acute osseous abnormalities are seen.  IMPRESSION: Lungs hypoexpanded. Mild bibasilar opacities may reflect atelectasis or possibly mild pneumonia, given the patient's symptoms. Small left pleural effusion seen. Cardiomegaly.   Electronically Signed   By: Roanna Raider M.D.   On: 08/14/2015 22:23     Management plans discussed with the patient, family and they are in agreement.  CODE STATUS:     Code Status Orders        Start     Ordered   08/15/15 0136  Full code   Continuous     08/15/15 0135      TOTAL TIME TAKING CARE OF THIS PATIENT: 40 minutes.    Carron Mcmurry M.D on 08/16/2015 at 2:52  PM  Between 7am to 6pm - Pager - [phone] After 6pm go to [email or web address] - password [email or web address] [facility]  [facility]  Office  [phone]  CC: Primary care physician; [facility]

## 2015-08-17 LAB — URINE CULTURE
Culture: >=100000
SPECIAL REQUESTS: NORMAL

## 2015-08-17 NOTE — Progress Notes (Signed)
Patient ID: Carly Jones, female   DOB: [birth year], 81 y.o.   MRN: [id number]  Called by Micro lab about ESBL in Urine culture.  This is sensitive to zosyn and patient was discharged on Augmentin which would cover the organism.  Dr Renae Gloss

## 2015-08-19 LAB — CULTURE, BLOOD (ROUTINE X 2)
Culture: NO GROWTH
Culture: NO GROWTH

## 2015-08-21 ENCOUNTER — Emergency Department (HOSPITAL_COMMUNITY)
Admission: EM | Admit: 2015-08-21 | Discharge: 2015-08-21 | Disposition: A | Discharge status: Home / Self Care | Payer: Self-pay | Attending: Emergency Medicine | Admitting: Emergency Medicine

## 2015-08-21 ENCOUNTER — Emergency Department (HOSPITAL_COMMUNITY): Payer: Self-pay

## 2015-08-21 ENCOUNTER — Encounter (HOSPITAL_COMMUNITY): Payer: Self-pay | Admitting: *Deleted

## 2015-08-21 DIAGNOSIS — Y9389 Activity, other specified: Secondary | ICD-10-CM | POA: Insufficient documentation

## 2015-08-21 DIAGNOSIS — I10 Essential (primary) hypertension: Secondary | ICD-10-CM | POA: Insufficient documentation

## 2015-08-21 DIAGNOSIS — M25552 Pain in left hip: Secondary | ICD-10-CM

## 2015-08-21 DIAGNOSIS — M25562 Pain in left knee: Secondary | ICD-10-CM

## 2015-08-21 DIAGNOSIS — E119 Type 2 diabetes mellitus without complications: Secondary | ICD-10-CM | POA: Insufficient documentation

## 2015-08-21 DIAGNOSIS — S79912A Unspecified injury of left hip, initial encounter: Secondary | ICD-10-CM | POA: Insufficient documentation

## 2015-08-21 DIAGNOSIS — Z79899 Other long term (current) drug therapy: Secondary | ICD-10-CM | POA: Insufficient documentation

## 2015-08-21 DIAGNOSIS — Y92009 Unspecified place in unspecified non-institutional (private) residence as the place of occurrence of the external cause: Secondary | ICD-10-CM | POA: Insufficient documentation

## 2015-08-21 DIAGNOSIS — E669 Obesity, unspecified: Secondary | ICD-10-CM | POA: Insufficient documentation

## 2015-08-21 DIAGNOSIS — M545 Low back pain: Secondary | ICD-10-CM

## 2015-08-21 DIAGNOSIS — Y999 Unspecified external cause status: Secondary | ICD-10-CM | POA: Insufficient documentation

## 2015-08-21 DIAGNOSIS — S8992XA Unspecified injury of left lower leg, initial encounter: Secondary | ICD-10-CM | POA: Insufficient documentation

## 2015-08-21 DIAGNOSIS — S3992XA Unspecified injury of lower back, initial encounter: Secondary | ICD-10-CM | POA: Insufficient documentation

## 2015-08-21 DIAGNOSIS — W19XXXA Unspecified fall, initial encounter: Secondary | ICD-10-CM | POA: Insufficient documentation

## 2015-08-21 DIAGNOSIS — Z792 Long term (current) use of antibiotics: Secondary | ICD-10-CM | POA: Insufficient documentation

## 2015-08-21 MED ORDER — OXYCODONE-ACETAMINOPHEN 5-325 MG PO TABS: 1.0000 | ORAL_TABLET | Freq: Once | ORAL | Status: DC

## 2015-08-21 MED ORDER — OXYCODONE-ACETAMINOPHEN 5-325 MG PO TABS
1.0000 | ORAL_TABLET | Freq: Once | ORAL | Status: AC
  Administered 2015-08-21: 1 via ORAL
  Filled 2015-08-21: qty 1

## 2015-08-21 MED ORDER — OXYCODONE-ACETAMINOPHEN 5-325 MG PO TABS: 1.0000 | ORAL_TABLET | ORAL | Status: AC | PRN

## 2015-08-21 NOTE — ED Notes (Signed)
Waiting for walker

## 2015-08-21 NOTE — Care Management Note (Signed)
Case Management Note  Patient Details  Name: Carly Jones MRN: [id number] Date of Birth: 21-Jul-1934  Subjective/Objective:                    Action/Plan:Standard Dan HumphreysWalker [name]. Will be available for any additional  CM needs.    Expected Discharge Date:                  Expected Discharge Plan:  Home/Self Care  In-House Referral:     Discharge planning Services     Post Acute Care Choice:    Choice offered to:     DME Arranged:  Dan HumphreysWalker DME Agency:  [facility]  [facility]     Status of Service:  Completed, signed off  Medicare Important Message Given:    Date Medicare IM Given:    Medicare IM give by:    Date Additional Medicare IM Given:    Additional Medicare Important Message give by:     If discussed at Long Length of Stay Meetings, dates discussed:    Additional Comments:  Yvone NeuCrutchfield, Olajuwon Fosdick M, RN 08/21/2015, 2:55 PM

## 2015-08-21 NOTE — Progress Notes (Signed)
Orthopedic Tech Progress Note Patient Details:  [name] [birth year] [id number]  Ortho Devices Type of Ortho Device: Knee Immobilizer Ortho Device/Splint Interventions: Application   Saul FordyceJennifer C Janila Arrazola 08/21/2015, 2:49 PM

## 2015-08-21 NOTE — ED Notes (Signed)
Patient was able to ambulate with assist.

## 2015-08-21 NOTE — Discharge Instructions (Signed)
Dolor de cadera (Hip Pain) La cadera es la articulacin entre la parte superior de las piernas y la parte inferior de la pelvis. Los TransMontaignehuesos, Research scientist (physical sciences)el cartlago, los tendones y los msculos de la articulacin de la cadera trabajan arduamente cada da para sostener el peso del cuerpo y Nurse, children'spermitir el desplazamiento. El Engineer, miningdolor de cadera puede tener distintos grados, desde un dolor leve hasta un dolor intenso en uno o ambos lados de la cadera. El dolor puede sentirse en la parte interna de la articulacin de la cadera, cerca de la ingle, o en la parte externa, cerca de los glteos y la parte superior de los muslos. Tambin puede estar acompaado de hinchazn o entumecimiento.  INSTRUCCIONES PARA EL CUIDADO EN EL HOGAR   Tome los medicamentos solamente como se lo haya indicado el mdico.  Aplique hielo sobre la zona lesionada.  Ponga el hielo en una bolsa plstica.  Coloque una toalla entre la piel y la bolsa de hielo.  Aplique el hielo de 3 a 4 veces por da, durante 15 a 20minutos en cada aplicacin.  Mantenga la pierna levantada (elevada) siempre que sea posible, para reducir la hinchazn.  Evite las actividades que Teaching laboratory techniciancausan dolor.  Siga los ejercicios especficos segn las indicaciones del mdico.  Duerma con una almohada entre las piernas del lado que le sea ms cmodo.  Anote la frecuencia con la que tiene dolor en la cadera, la ubicacin del dolor y lo que siente. SOLICITE ATENCIN MDICA SI:   No puede apoyar el peso del cuerpo DTE Energy Companysobre la pierna.  La cadera est enrojecida o hinchada, o muy dolorida con la palpacin.  El dolor o la hinchazn continan o empeoran despus de 1semana.  Tiene una creciente dificultad para caminar.  Tiene fiebre. SOLICITE ATENCIN MDICA DE INMEDIATO SI:   Se ha cado.  El dolor y la hinchazn de la cadera aumentan de repente. ASEGRESE DE QUE:   Comprende estas instrucciones.  Controlar su afeccin.  Recibir ayuda de inmediato si no mejora o si  empeora.   Esta informacin no tiene Theme park managercomo fin reemplazar el consejo del mdico. Asegrese de hacerle al mdico cualquier pregunta que tenga.   Document Released: 03/09/2014 Elsevier Interactive Patient Education 2016 [employer]vier Inc.  Dolor de rodilla (Knee Pain) El dolor de rodilla es un sntoma muy comn y puede tener muchas causas. Suele desaparecer cuando se siguen las indicaciones del mdico en lo que respecta a Engineer, materialsaliviar el dolor y las molestias en la casa. Sin embargo, puede Scientist, product/process developmentprogresar hasta convertirse en una afeccin que requiere [place] Lakestratamiento. Algunas afecciones pueden incluir lo siguiente:  Artritis por uso y Chiropractordesgaste (artrosis).  Artritis por hinchazn e irritacin (artritis reumatoide o gota).  Un quiste o un crecimiento en la rodilla.  Una infeccin en la articulacin de la rodilla.  Una lesin que no se [place].  Dao, hinchazn o irritacin de los tejidos que sostienen la rodilla (distensin de ligamentos o tendinitis). Si el dolor de Paramedicrodilla persiste, tal vez haya que realizar ms estudios para Scientist, forensicdiagnosticar la afeccin, los cuales pueden incluir radiografas u otros estudios de diagnstico por imgenes de la rodilla. Adems, es posible que haya que extraerle lquido de la rodilla. El tratamiento del dolor continuo de rodilla depende de la causa, pero puede incluir lo siguiente:  Medicamentos para Engineer, materialsaliviar el dolor o reducir la hinchazn.  Inyecciones de corticoides en la rodilla.  Fisioterapia.  Ciruga. INSTRUCCIONES PARA EL CUIDADO EN EL HOGAR  Tome los medicamentos solamente como se lo haya indicado  el mdico.  Mantenga la rodilla en reposo y en alto (elevada) mientras est descansando.  No haga cosas que le causen dolor o que lo intensifiquen.  Evite las actividades o los ejercicios de alto impacto, como correr, Public relations account executive soga o hacer saltos de tijera.  Aplique hielo sobre la zona de la rodilla:  Ponga el hielo en una bolsa plstica.  Coloque una toalla entre la  piel y la bolsa de hielo.  Coloque el hielo durante 20 minutos, 2 a 3 veces por da.  Pregntele al mdico si debe usar una Neurosurgeon.  Cuando duerma, pngase una almohada debajo de la rodilla.  Baje de peso si es necesario. El [place] extra puede generar presin en la rodilla.  No consuma ningn producto que contenga tabaco, lo que incluye cigarrillos, tabaco de Theatre manager o Administrator, Civil Service. Si necesita ayuda para dejar de fumar, consulte al mdico. Fumar puede retrasar la curacin de cualquier problema que tenga en el hueso y Nurse, learning disability. SOLICITE ATENCIN MDICA SI:  El dolor de rodilla contina, [place].  Tiene fiebre junto con dolor de rodilla.  La rodilla se le tuerce o se le traba.  La rodilla est ms hinchada. SOLICITE ATENCIN MDICA DE INMEDIATO SI:   La articulacin de la rodilla est caliente al tacto.  Tiene dolor en el pecho o dificultad para respirar.   Esta informacin no tiene Theme park manager el consejo del mdico. Asegrese de hacerle al mdico cualquier pregunta que tenga.   Document Released: 04/10/2008 Document Revised: 11/13/2014 Elsevier Interactive Patient Education 2016 [employer].  Cmo usar Neomia Dear rodillera (How to Use a Knee Brace) [place] es un dispositivo que se [place] para brindar sujecin a la rodilla, especialmente durante un perodo de recuperacin despus de Neomia Dear lesin o [place]. Hay varios tipos de rodilleras. Algunas estn diseadas para evitar las lesiones (rodilleras de prevencin). A menudo se usan durante la prctica de deportes. Otras sirven para estabilizar una rodilla lesionada (rodillera funcional) o para mantenerla inmvil mientras se recupera de una lesin (rodillera para rehabilitacin). Las [employer] tienen artritis grave de rodilla pueden beneficiarse con una rodillera que quita algo de presin de la rodilla (rodillera de descarga). La mayora de las rodilleras estn fabricadas con una  combinacin de tela y metal o plstico.  Es posible que deba usar una rodillera para lo siguiente:  Engineer, materials de rodilla.  Ayudar a la rodilla a Engineer, site (mejorar la estabilidad).  Poder caminar distancias ms largas (mejorar la movilidad).  Evitar las lesiones.  Brindar estabilidad a la rodilla mientras se recupera de [place] o una lesin. RIESGOS Y COMPLICACIONES Generalmente, las rodilleras son muy seguras de usar. Sin embargo, pueden Teacher, music, por ejemplo:  Irritacin de la piel que puede derivar en una infeccin.  Agravar la afeccin si la rodillera se [place] de un modo incorrecto. CMO USAR UNA RODILLERA Las diferentes rodilleras tendrn distintas instrucciones de [place]. El Firefighter o Therapist, music lo siguiente:  Cmo English as a second language teacher.  Cmo ajustarla.  Cundo y con qu frecuencia usar la rodillera.  Cmo quitrsela.  Si necesita dispositivos de [employer] de la rodillera, como muletas o un bastn. En general, la rodillera:  Debe tener la bisagra alineada con la flexin de la rodilla.  Debe tener correas, sistemas de cierre o cintas que se ajusten bien alrededor de la rodilla.  No debe estar muy ajustada ni muy floja. CMO CUIDAR UNA RODILLERA  Revise  frecuentemente la rodillera para detectar signos de dao, por ejemplo, conexiones o accesorios sueltos. Durante el uso normal, la rodillera puede daarse o Acupuncturist.  Lave las partes de tela de la rodillera con agua y [place].  Lea las instrucciones de uso que se adjuntan con la rodillera para Solicitor otras indicaciones especficas respecto de los cuidados. SOLICITE ATENCIN MDICA SI:  La rodillera est muy floja o [place], y no logra acomodarla correctamente.  La rodillera le produce enrojecimiento, hinchazn, hematomas o irritacin en la piel.  La rodillera no resulta eficaz.  La rodillera est intensificando el dolor de rodilla.   Esta informacin  no tiene Theme park manager el consejo del mdico. Asegrese de hacerle al mdico cualquier pregunta que tenga.   Document Released: 02/08/2009 Document Revised: 07/14/2015 Elsevier Interactive Patient Education 2016 Elsevier Inc.  Terapia con calor (Heat Therapy) La terapia con calor puede ayudar a aliviar articulaciones y msculos doloridos, lesionados, tensos y rgidos. El calor [employer], lo cual puede ayudar a Engineer, materials.  RIESGOS Y COMPLICACIONES Si tiene cualquiera de los [address], no utilice la terapia con calor a menos que su mdico lo haya autorizado:  Mala circulacin.  Heridas que se estn curando o piel con cicatrices en la zona a tratar.  Diabetes, enfermedades cardacas o hipertensin arterial.  Incapacidad de sentir (entumecimiento) la zona tratada.  Hinchazn inusual de la zona a tratar.  Infecciones activas.  Cogulos sanguneos.  Cncer.  Incapacidad de Marketing executive. Esto puede incluir nios pequeos y personas que tienen problemas con la funcin cerebral (demencia).  Embarazo. La terapia con calor solo se debe usar en lesiones viejas, preexistentes o de larga duracin (crnicas). No utilice la terapia con calor en lesiones nuevas a menos que el mdico se lo indique. CMO USAR LA TERAPIA CON CALOR Existen varios tipos distintos de terapia con calor, como:  Compresas hmedas calientes.  Bao de agua caliente.  Bolsa de agua caliente.  Almohadilla trmica.  Bolsa de gel caliente.  Vendaje caliente.  Almohadilla trmica. Utilice el mtodo de terapia con calor que le sugiera su mdico. Siga las indicaciones del mdico sobre cmo y cundo usar la terapia con Airline pilot. RECOMENDACIONES GENERALES PARA LA TERAPIA CON CALOR  No duerma mientras [place] la terapia con calor. Utilice la terapia con calor solo mientras est despierto.  La piel puede volverse rosada mientras [place] la terapia con calor. No use la terapia con calor si la  piel se pone roja.  No use la terapia con calor si siente un dolor nuevo.  Una temperatura muy alta o una exposicin prolongada al calor puede causar quemaduras. Sea cauto con la terapia de calor para evitar quemar la piel.  No use la terapia con calor en zonas de la piel que ya estn irritadas, como con una erupcin o una quemadura de sol. SOLICITE ATENCIN MDICA SI:  Observa ampollas, enrojecimiento, hinchazn o adormecimiento.  Siente un dolor nuevo.  El dolor [place]. ASEGRESE DE QUE:  Comprende estas instrucciones.  Controlar su afeccin.  Recibir ayuda de inmediato si no mejora o si empeora.   Esta informacin no tiene Theme park manager el consejo del mdico. Asegrese de hacerle al mdico cualquier pregunta que tenga.   Document Released: 01/15/2012 Document Revised: 11/13/2014 Elsevier Interactive Patient Education 2016 [employer]vin[employer]itor.  Crioterapia  (Cryotherapy)  El trmino crioterapia significa tratamiento mediante el fro. Bolsas con hielo o gel se utilizan para reducir Chief Technology Officer y la inflamacin. El hielo es ms efectivo  dentro de las primeras 24 a 48 horas despus de una lesin o trastornos por uso excesivo de un msculo o Risk analyst. El hielo puede calmar esguinces, distensiones, espasmos, ardor, dolor punzante y [employer]. Tambin puede usarse para la recuperacin luego de [place]. El hielo es [place], tiene muy pocos efectos adversos y es seguro para que lo utilicen la mayora de [employer].  PRECAUCIONES  El hielo no es una opcin segura de tratamiento para las personas con:   Fenmeno de [place]. Este es un trastorno que afecta los vasos sanguneos pequeos en las extremidades. La exposicin al fro [employer] Energy Company problemas vuelvan.  Hipersensibilidad al fro. Hay diferentes tipos de hipersensibilidad al fro, [employer] se incluyen:  Urticaria por el fro. Ronchas rojas y que pican que aparecen en la piel cuando los tejidos  comienzan a calentarse despus de recibir el fro.  Eritema por fro. Se trata de una erupcin de color rojo y que pica, causada por la exposicin al fro.  Hemoglobinuria por fro. Los glbulos rojos se destruyen cuando los tejidos comienzan a calentarse despus de enfriarse. La hemoglobina que transporta oxgeno pasa a la orina debido a que no se puede combinar con protenas de la sangre lo suficientemente rpido.  Entumecimiento o alteracin de la sensibilidad en el rea que se enfra. Si usted tiene [employer], no utilice hielo hasta que haya hablado con su mdico:   Enfermedades cardacas, como arritmias, angina o enfermedad cardaca crnica.  Hipertensin arterial.  Heridas que se estn curando o abiertas en la zona en la que va a aplicar el hielo.  Infecciones actuales.  Artritis reumatoidea.  Mala circulacin.  Diabetes. El hielo disminuye el flujo de sangre en la regin en la que se aplica. Esto es beneficioso cuando se trata de evitar que se propaguen ciertas sustancias qumicas irritantes desde los tejidos inflamados a los tejidos circundantes. Sin embargo, si se expone la piel a las temperaturas fras durante demasiado tiempo o sin la proteccin [place], puede daarse la piel o los nervios. Observe si hay seales de dao en la piel debido al fro.  INSTRUCCIONES PARA EL CUIDADO EN EL HOGAR  Siga estos consejos para usar hielo y compresas fras con seguridad.   Coloque una toalla seca o hmeda entre el hielo y la piel. Una toalla hmeda se enfriar ms rpidamente la piel, lo que puede hacer necesario acortar el tiempo que se utiliza el hielo.  Para obtener una respuesta ms rpida, puede comprimir suavemente el hielo.  Aplique el hielo durante no ms de 10 a 20 minutos a la vez. Cuanto ms hueso haya en la zona en la que aplique el hielo, menos tiempo se necesitar para obtener los beneficios.  Revise su piel despus de 5 minutos para asegurarse  de que no hay seales de [employer] al fro o un dao en la piel.  Descanse 20 minutos o ms [employer].  Una vez que la piel est adormecida, puede finalizar el [place]. Puede probar si hay adormecimiento tocando ligeramente la piel. El toque debe ser tan ligero que no deje un hoyuelo en la piel por la presin hecha con la punta del dedo. Al aplicar hielo, la [employer]son de las personas sentirn sensaciones normales en este orden: fro, ardor, dolor y entumecimiento.  No use hielo sobre alguien que no puede comunicar sus respuestas al dolor, como los nios pequeos o personas con demencia. CMO HACER UNA COMPRESA DE HIELO  Las compresas de hielo son el modo ms frecuente de Chemical engineer la terapia con hielo. Otros mtodos son los masajes con hielo, baos de hielo, y aerosol fro. Las cremas musculares que producen fro, sensacin de hormigueo no ofrecen los mismos beneficios que ofrece el hielo y no debe ser utilizado como un sustituto excepto que lo recomiende su mdico.  Para hacer una compresa de hielo, haga lo siguiente:   Ponga hielo picado o una bolsa de verduras congeladas en una bolsa de plstico con cierre. Extraiga el exceso de [place]. Coloque esta bolsa dentro de [place] bolsa de plstico. Deslice la bolsa en una funda de almohada o coloque una toalla hmeda entre su piel y la [place].  Mezcle 3 partes de agua con 1 parte de alcohol fino. Congelar la mezcla en una bolsa plstica con cierre. Cuando se retira Set designer del Electrical engineer, tendr un aspecto fangoso. Extraiga el exceso de [place]. Coloque esta bolsa dentro de [place] bolsa de plstico. Deslice la bolsa en una funda de almohada o coloque una toalla hmeda entre su piel y la [place]. SOLICITE ATENCIN MDICA SI:   Tiene manchas blancas en la piel. Esto puede dar a la piel una apariencia (moteada).  Su piel se vuelve azul o plida.  Tiene un aspecto ceroso o est dura.  La hinchazn empeora. ASEGRESE DE QUE:   Comprende  estas instrucciones.  Controlar su enfermedad.  Solicitar ayuda de inmediato si no mejora o si empeora.   Esta informacin no tiene Theme park manager el consejo del mdico. Asegrese de hacerle al mdico cualquier pregunta que tenga.   Document Released: 10/12/2011 Document Revised: 01/15/2012 Elsevier Interactive Patient Education 2016 [employer].  Dolor de espalda en adultos (Back Pain, Adult) El dolor de espalda es muy frecuente en los adultos.La causa del dolor de espalda es rara vez peligrosa y Chief Technology Officer a menudo mejora con el [place].Es posible que se desconozca la causa de esta afeccin. Algunas causas comunes son las siguientes:  Distensin de los msculos o ligamentos que sostienen la columna vertebral.  Chiropractor (degeneracin) de los discos vertebrales.  Artritis.  Lesiones directas en la espalda. En [employer], el dolor de espalda es recurrente. Como rara vez es peligroso, las personas pueden aprender a Psychologist, clinical afeccin por s mismas. INSTRUCCIONES PARA EL CUIDADO EN EL HOGAR Controle su dolor de espalda a fin de Public house manager cambio. Las siguientes indicaciones ayudarn a Architectural technologist que pueda sentir:  Medical illustrator. Si permanece sentado o de pie en un mismo lugar durante mucho tiempo, se tensiona la espalda. No se siente, conduzca o permanezca de pie en un mismo lugar durante ms de 30 minutos seguidos. Realice caminatas cortas en superficies planas tan pronto como le sea posible.Trate de caminar un poco ms de Pharmacist, community.  Haga ejercicio regularmente como se lo haya indicado el mdico. El ejercicio ayuda a que su espalda se cure ms rpidamente. Tambin ayuda a prevenir futuras lesiones al [employer] fuertes y flexibles.  No permanezca en la cama.Si hace reposo ms de 1 a 2 das, puede demorar su recuperacin.  Preste atencin a su cuerpo al inclinarse y levantarse. Las posiciones ms cmodas son las que ejercen  menos tensin en la espalda en recuperacin. Siempre use tcnicas apropiadas para levantar objetos, como por ejemplo:  Flexionar las rodillas.  Mantener la carga cerca del cuerpo.  No torcerse.  Encuentre una posicin cmoda para dormir. Use un colchn firme y recustese de costado con las  rodillas ligeramente flexionadas. Si se recuesta [employer], coloque una almohada debajo de las rodillas.  Evite sentir ansiedad o estrs.El estrs aumenta la tensin muscular y puede empeorar el dolor de espalda.Es importante reconocer si se siente ansioso o estresado y aprender maneras de controlarlo, por ejemplo haciendo ejercicio.  Tome los medicamentos solamente como se lo haya indicado el mdico. Los medicamentos de venta libre para Engineer, materials y la inflamacin a menudo son los ms eficaces.El mdico puede recetarle relajantes musculares.Estos medicamentos ayudan a Primary school teacher de modo que pueda reanudar ms rpidamente sus actividades normales y el ejercicio saludable.  Aplique hielo sobre la zona lesionada.  Ponga el hielo en una bolsa plstica.  Coloque una toalla entre la piel y la bolsa de hielo.  Deje el hielo durante , 2 a 3veces por da, durante los primeros 2 o 3das. Despus de eso, puede alternar el hielo y el calor para reducir Chief Technology Officer y los espasmos.  Mantenga un peso saludable. El exceso de peso ejerce presin adicional sobre la espalda y hace que resulte difcil mantener una buena [place]. SOLICITE ATENCIN MDICA SI:  Siente un dolor que no se alivia con reposo o medicamentos.  Siente mucho dolor que se extiende a las piernas o los glteos.  El dolor no mejora en una semana.  Siente dolor por la noche.  Pierde peso.  Siente escalofros o fiebre. SOLICITE ATENCIN MDICA DE INMEDIATO SI:   Tiene nuevos problemas para controlar la vejiga o los intestinos.  Siente debilidad o adormecimiento inusuales en los brazos o en las piernas.  Siente  nuseas o vmitos.  Siente dolor abdominal.  Siente que va a desmayarse.   Esta informacin no tiene Theme park manager el consejo del mdico. Asegrese de hacerle al mdico cualquier pregunta que tenga.   Document Released: 10/23/2005 Document Revised: 11/13/2014 Elsevier Interactive Patient Education [employer]

## 2015-08-21 NOTE — ED Notes (Signed)
Attempted to sit patient on the side of the bed and walk however patient wasn't able to stand states it hurts to bad.

## 2015-08-21 NOTE — ED Notes (Addendum)
Pt in from home c/o left hip and knee pain, pt fell yesterday, increased pain with movement since, pt having trouble bearing weight on leg, no deformity noted, CMS intact

## 2015-08-21 NOTE — ED Notes (Signed)
Pt at xray

## 2015-08-21 NOTE — ED Notes (Signed)
Pt stable, ambulatory, states understanding of discharge instructions, going home with walker

## 2015-08-21 NOTE — ED Provider Notes (Signed)
CSN: [id number]     Arrival date & time 08/21/15  1003 History   First MD Initiated Contact with Patient 08/21/15 1007     Chief Complaint  Patient presents with  . Hip Pain   HPI  Carly Jones is a 81 year old female with PMHx of HTN and DM2 presenting after a fall. Pt speaks spanish only but her son is present in the room to interpret for her. Pt was attempting to walk to the bathroom yesterday when she became unsteady and fell forwards onto her hands and knees. Denies head injury or LOC. Pt lives with her daughter who witnessed the fall and corroborates the story. Pt denies chest pain, SOB or dizziness before her fall. She is complaining of left knee, left hip and lower back pain. Pt states that the knee is more swollen than usual and is aching. The pain increases when she attempts to flex, extend or put weight on it. She states that he pain decreases when she rests. Pt states that she has aching pain in her hip and lumbar region too but it is less severe than her knee. Denies numbness or tingling in her left leg. She denies any wounds sustained in the fall. She does not take blood thinners. She has not taken any pain relievers at home for symptoms. Pt was discharged from the hospital 5 days ago after being treated for CAP. She reports compliance with her antibiotics and improvement in her symptoms. She denies fevers, chills, headaches, changes in vision, chest pain, SOB, cough, wheezing, abdominal pain, nausea, vomiting.   Past Medical History  Diagnosis Date  . Diabetes mellitus type 2 in obese (HCC)   . Essential hypertension    History reviewed. No pertinent past surgical history. Family History  Problem Relation Age of Onset  .      Social History  Substance Use Topics  . Smoking status: Never Smoker   . Smokeless tobacco: Never Used  . Alcohol Use: None   OB History    No data available     Review of Systems  Constitutional: Negative for fever and chills.  Eyes: Negative  for visual disturbance.  Respiratory: Negative for cough, shortness of breath and wheezing.   Cardiovascular: Negative for chest pain.  Gastrointestinal: Negative for nausea, vomiting and abdominal pain.  Musculoskeletal: Positive for back pain, joint swelling, arthralgias and gait problem. Negative for neck pain.  Skin: Negative for wound.  Neurological: Negative for dizziness, syncope, weakness, light-headedness, numbness and headaches.  Hematological: Does not bruise/bleed easily.  Psychiatric/Behavioral: Negative for confusion.  All other systems reviewed and are negative.     Allergies  Review of patient's allergies indicates no known allergies.  Home Medications   Prior to Admission medications   Medication Sig Start Date End Date Taking? Authorizing Provider  amoxicillin-clavulanate (AUGMENTIN) 875-125 MG tablet Take 1 tablet by mouth every 12 (twelve) hours. 08/16/15  Yes Enedina Finner, MD  aspirin 81 MG tablet Take 81 mg by mouth daily.   Yes Historical Provider, MD  atorvastatin (LIPITOR) 10 MG tablet Take 10 mg by mouth daily.   Yes Historical Provider, MD  guaiFENesin (MUCINEX) 600 MG 12 hr tablet Take 1 tablet (600 mg total) by mouth 2 (two) times daily. 08/16/15  Yes Enedina Finner, MD  lisinopril (PRINIVIL,ZESTRIL) 10 MG tablet Take 10 mg by mouth daily.   Yes Historical Provider, MD  metFORMIN (GLUCOPHAGE) 1000 MG tablet Take 1,000 mg by mouth 2 (two) times daily with  a meal.   Yes Historical Provider, MD  oxyCODONE-acetaminophen (PERCOCET/ROXICET) 5-325 MG tablet Take 1-2 tablets by mouth every 4 (four) hours as needed for severe pain. 08/21/15   Tyashia Morrisette, PA-C   BP 139/61 mmHg  Pulse 75  Temp(Src) 98.3 F (36.8 C) (Oral)  Resp 16  SpO2 96% Physical Exam  Constitutional: She appears well-developed and well-nourished. No distress.  HENT:  Head: Normocephalic and atraumatic.  Eyes: Conjunctivae are normal. Right eye exhibits no discharge. Left eye exhibits no  discharge. No scleral icterus.  Neck: Normal range of motion.  No cervical spine tenderness  Cardiovascular: Normal rate, regular rhythm, normal heart sounds and intact distal pulses.   Pedal pulses palpable  Pulmonary/Chest: Effort normal and breath sounds normal. No respiratory distress. She has no wheezes. She has no rales.  Abdominal: Soft. There is no tenderness. There is no rebound and no guarding.  Musculoskeletal: Normal range of motion.  Left knee swelling present, mostly prepatellar. No appreciable effusion. Pain with passive flexion/extension of knee. Pt able to straight leg raise with mild pain. Left knee diffusely TTP. Left hip non-tender. No tenderness over thoracic or lumbar spine. No bony deformities  Neurological: She is alert. Coordination normal.  4/5 strength in left hip. Could not assess knee strength due to pain. 5/5 ankle strength. Sensation to light touch intact throughout  Skin: Skin is warm and dry.  Psychiatric: She has a normal mood and affect. Her behavior is normal.  Nursing note and vitals reviewed.   ED Course  Procedures (including critical care time) Labs Review Labs Reviewed - No data to display  Imaging Review Dg Lumbar Spine Complete  08/21/2015  CLINICAL DATA:  Fall.  Back pain. EXAM: LUMBAR SPINE - COMPLETE 4+ VIEW COMPARISON:  None. FINDINGS: Osteopenia. 8 mm anterolisthesis L5 upon S1. Slight anterolisthesis L4 on L5. Facet arthropathy at L4-5 and L5-S1. No vertebral compression deformity. Disc height is relatively maintained. IMPRESSION: No acute bony pathology.  Degenerative change. Electronically Signed   By: Jolaine ClickArthur  Hoss M.D.   On: 08/21/2015 11:24   Dg Knee Complete 4 Views Left  08/21/2015  CLINICAL DATA:  Status post fall, left knee pain EXAM: LEFT KNEE - COMPLETE 4+ VIEW COMPARISON:  None. FINDINGS: No fracture or dislocation is seen. Mild to moderate tricompartmental degenerative changes, most prominent in the patellofemoral compartment.  Small suprapatellar knee joint effusion. IMPRESSION: No fracture or dislocation is seen. Mild to moderate degenerative changes. Small suprapatellar knee joint effusion. Electronically Signed   By: Charline BillsSriyesh  Krishnan M.D.   On: 08/21/2015 11:18   Dg Hip Unilat With Pelvis 2-3 Views Left  08/21/2015  CLINICAL DATA:  Left hip/knee pain, status post fall EXAM: DG HIP (WITH OR WITHOUT PELVIS) 2-3V LEFT COMPARISON:  None. FINDINGS: No fracture or dislocation is seen. Bilateral hip joint spaces are symmetric. Mild degenerative changes the pubic symphysis. Visualized bony pelvis appears intact. Degenerative changes of the lower lumbar spine. IMPRESSION: No fracture or dislocation is seen. Electronically Signed   By: Charline BillsSriyesh  Krishnan M.D.   On: 08/21/2015 11:20   I have personally reviewed and evaluated these images and lab results as part of my medical decision-making.   EKG Interpretation None      MDM   Final diagnoses:  Fall, initial encounter  Hip pain, left  Left knee pain  Low back pain without sciatica, unspecified back pain laterality   10:15 - Pt presenting after a mechanical fall at home witnessed by her daughter. Denies chest  pain, SOB or dizziness associated with the fall. No head injury or LOC. Complains of lumbar back, left hip and left knee pain. VSS. Left knee edematous and TTP. Moderate pain when passively flexed and extended. No TTP of lumbar spine or left hip. No obvious deformities. Will xray back, hip and knee.  12:15 - Xrays negative for bony deformities. Attempted to ambulate pt in ED without success due to left knee pain. Will order knee immobilizer and walker and attempt to walk again 3:00 - Pt able to ambulate with walker and knee immobilizer with a steady gait. Will give short vicodin prescription for pain. Pt is not from [facility] and will follow up with her PCP early next week.  At this time there does not appear to be any evidence of an acute emergency medical condition  and the patient appears stable for discharge with appropriate outpatient follow up.Diagnosis was discussed with patient who verbalizes understanding and is agreeable to discharge. Pt case discussed with Dr. Estell Harpin who agrees with my plan. Return precautions given in discharge paperwork and discussed with pt at bedside. Pt stable for discharge        Alveta Heimlich, PA-C 08/21/15 1805  Bethann Berkshire, MD 08/22/15 1229

## 2017-03-19 IMAGING — DX DG KNEE COMPLETE 4+V*L*
4 series · 4 of 4 positions shown · non-contrast
Comparison: None.

CLINICAL DATA: Status post fall, left knee pain

EXAM:
LEFT KNEE - COMPLETE 4+ VIEW

[knee ap]
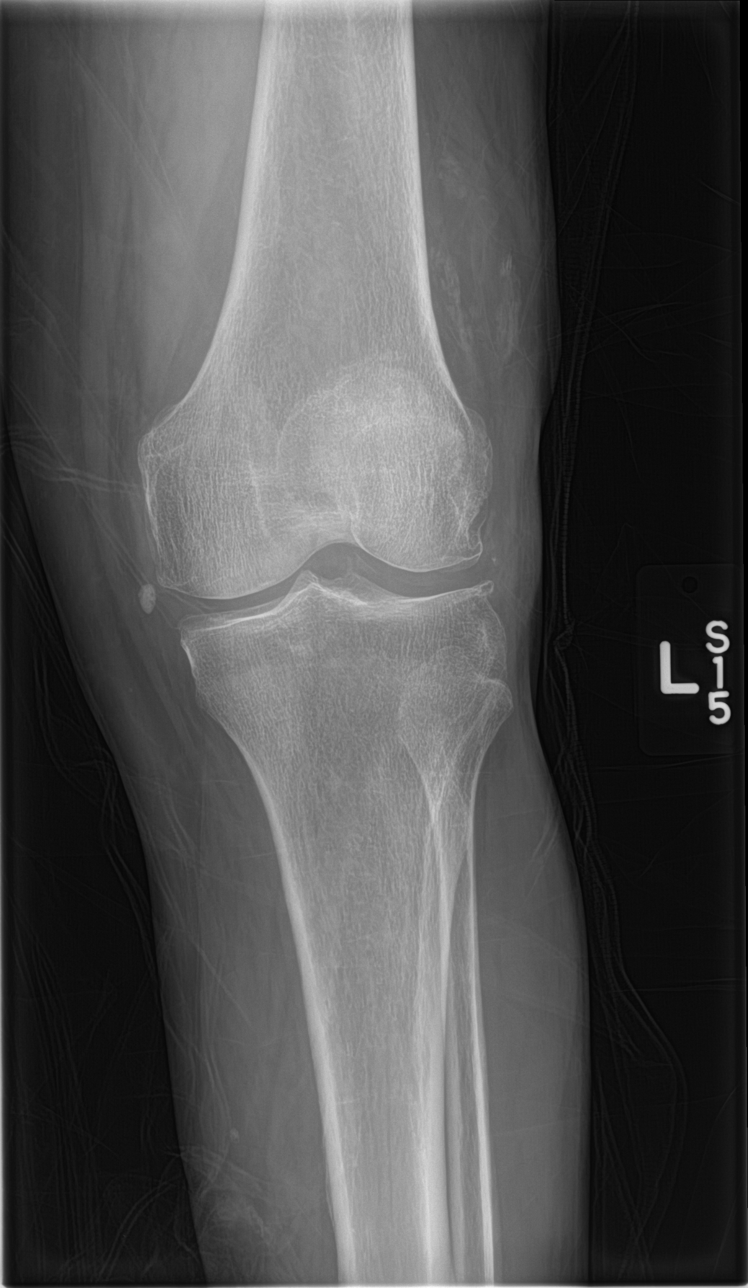

[knee lat]
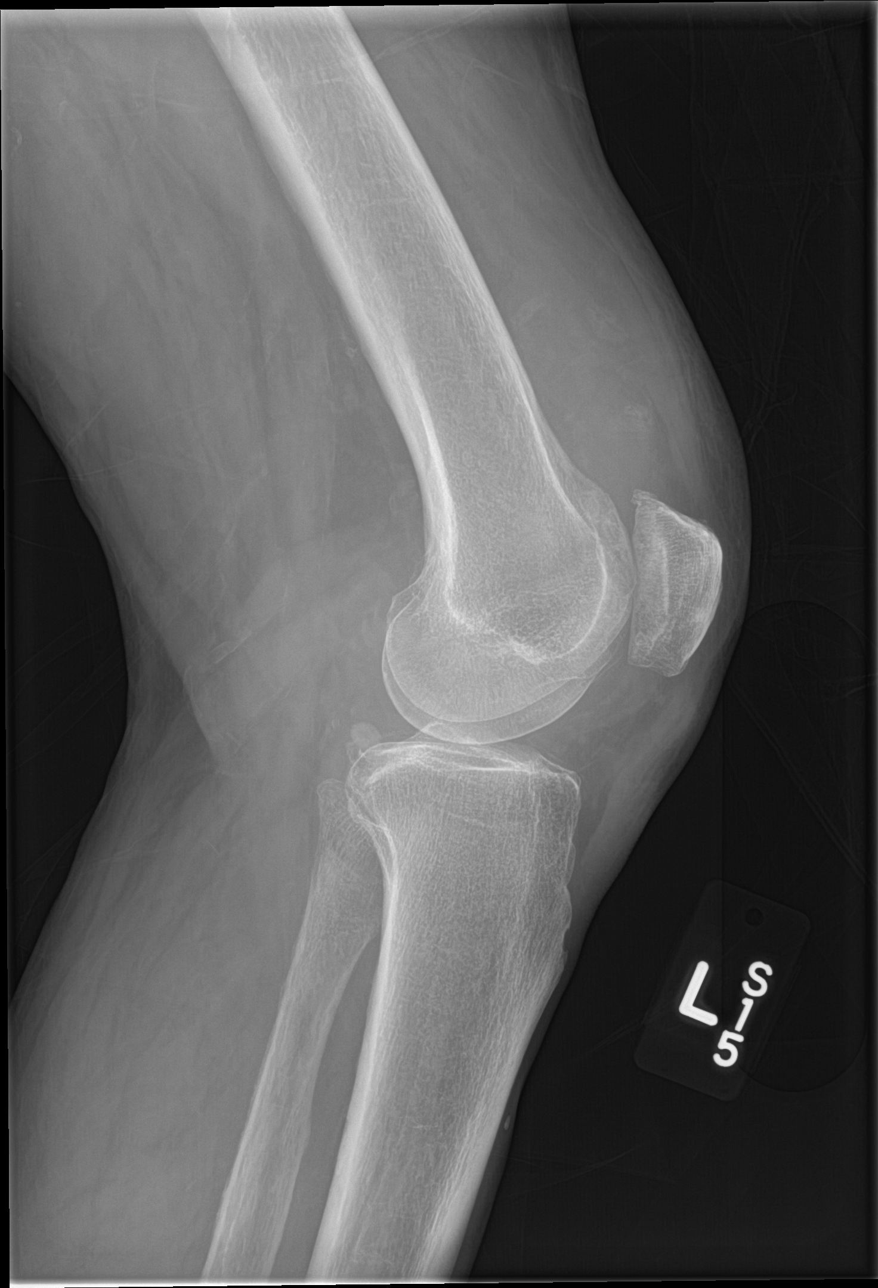

[knee obl (1 of 2)]
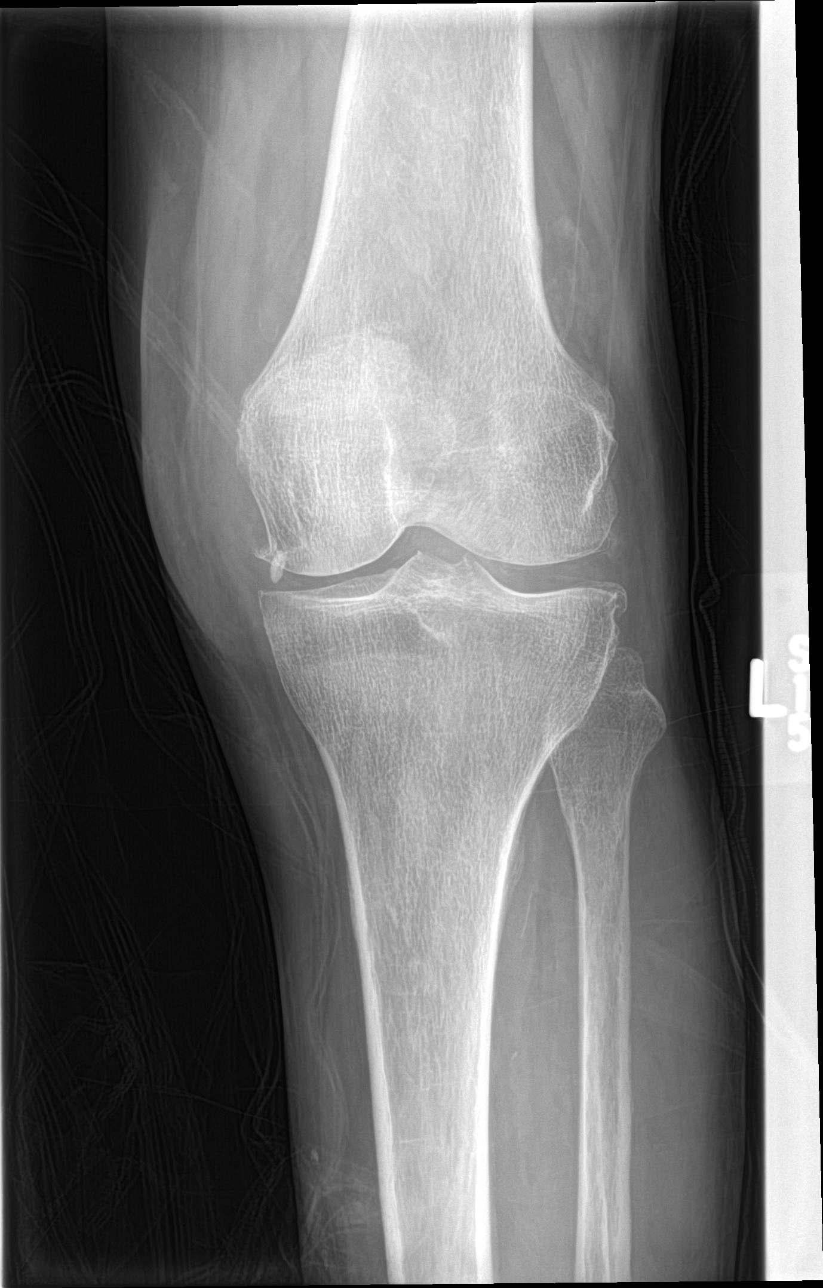

[knee obl (2 of 2)]
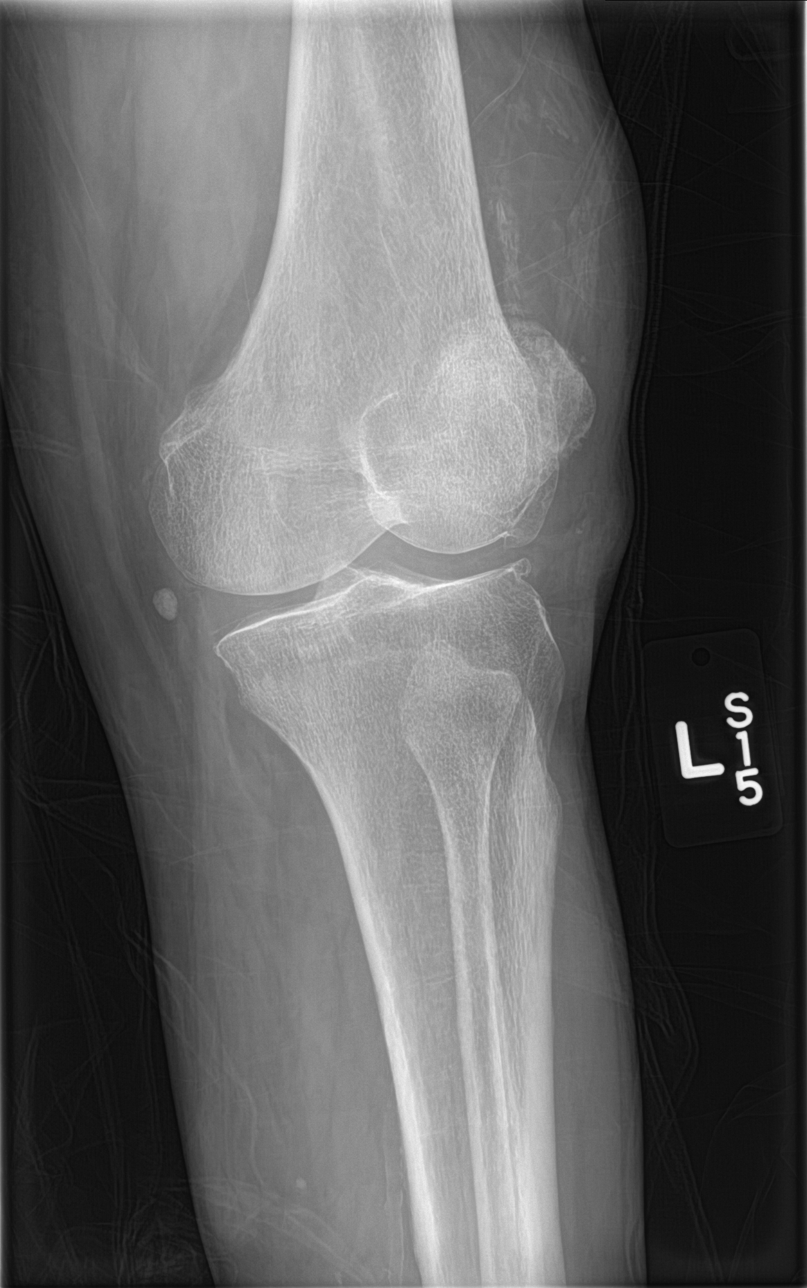

[4 of 4 positions shown; findings below may reference images not displayed]

FINDINGS: No fracture or dislocation is seen.

Mild to moderate tricompartmental degenerative changes, most
prominent in the patellofemoral compartment.

Small suprapatellar knee joint effusion.
IMPRESSION: No fracture or dislocation is seen.

Mild to moderate degenerative changes.

Small suprapatellar knee joint effusion.
